# Patient Record
Sex: Female | Born: 1970 | Hispanic: Yes | Marital: Married | Smoking: Never smoker
Health system: Southern US, Community
[De-identification: ages and names within clinical notes are randomized; demographics above are authoritative.]

## PROBLEM LIST (undated history)

## (undated) DIAGNOSIS — J45909 Unspecified asthma, uncomplicated: Secondary | ICD-10-CM

---

## 2020-03-07 ENCOUNTER — Encounter: Payer: Self-pay | Admitting: Emergency Medicine

## 2020-03-07 ENCOUNTER — Other Ambulatory Visit: Payer: Self-pay

## 2020-03-07 ENCOUNTER — Emergency Department
Admission: EM | Admit: 2020-03-07 | Discharge: 2020-03-07 | Disposition: A | Discharge status: Home / Self Care | Payer: BC Managed Care – PPO | Attending: Emergency Medicine | Admitting: Emergency Medicine

## 2020-03-07 DIAGNOSIS — T63461A Toxic effect of venom of wasps, accidental (unintentional), initial encounter: Secondary | ICD-10-CM | POA: Diagnosis not present

## 2020-03-07 DIAGNOSIS — J45909 Unspecified asthma, uncomplicated: Secondary | ICD-10-CM | POA: Insufficient documentation

## 2020-03-07 DIAGNOSIS — R238 Other skin changes: Secondary | ICD-10-CM | POA: Diagnosis present

## 2020-03-07 HISTORY — DX: Unspecified asthma, uncomplicated: J45.909

## 2020-03-07 MED ORDER — IBUPROFEN 800 MG PO TABS
800.0000 mg | ORAL_TABLET | Freq: Once | ORAL | Status: AC
  Administered 2020-03-07: 800 mg via ORAL
  Filled 2020-03-07: qty 1

## 2020-03-07 MED ORDER — DEXAMETHASONE SODIUM PHOSPHATE 10 MG/ML IJ SOLN
10.0000 mg | Freq: Once | INTRAMUSCULAR | Status: DC
  Filled 2020-03-07: qty 1

## 2020-03-07 MED ORDER — FAMOTIDINE 20 MG PO TABS
20.0000 mg | ORAL_TABLET | Freq: Once | ORAL | Status: AC
  Administered 2020-03-07: 20 mg via ORAL
  Filled 2020-03-07: qty 1

## 2020-03-07 MED ORDER — DEXAMETHASONE SODIUM PHOSPHATE 10 MG/ML IJ SOLN
10.0000 mg | Freq: Once | INTRAMUSCULAR | Status: AC
  Administered 2020-03-07: 10 mg via INTRAMUSCULAR

## 2020-03-07 NOTE — ED Triage Notes (Signed)
Pt arrived via POV with c/o possible insect bit to right leg, has multiple large wheal-like areas to leg. Pt c/o pain and a little bit of itching.

## 2020-03-07 NOTE — ED Provider Notes (Signed)
[facility] Emergency Department Provider Note  ____________________________________________   First MD Initiated Contact with Patient 03/07/20 2131     (approximate)  I have reviewed the triage vital signs and the nursing notes.   HISTORY  Chief Complaint Insect Bite    HPI Candice Mendoza is a 49 y.o. female presents emergency department complaining of multiple red bumps on her legs that staying.  States she was at the park walking and felt a stinging sensation in her leg.  States she ran right over to the emergency department to be checked out.  States very painful.  Unsure of what bit her.  No trouble breathing or throat swelling.  Pain is 8/10.   Past Medical History:  Diagnosis Date  . Asthma     There are no problems to display for this patient.   History reviewed. No pertinent surgical history.  Prior to Admission medications   Not on File    Allergies Patient has no known allergies.  History reviewed. No pertinent family history.  Social History Social History   Tobacco Use  . Smoking status: Never Smoker  . Smokeless tobacco: Never Used  Substance Use Topics  . Alcohol use: Not on file  . Drug use: Not on file    Review of Systems  Constitutional: No fever/chills Eyes: No visual changes. ENT: No sore throat. Respiratory: Denies cough Cardiovascular: Denies chest pain Genitourinary: Negative for dysuria. Musculoskeletal: Negative for back pain. Skin: Negative for rash.  Positive for red areas and stings on the leg Psychiatric: no mood changes,     ____________________________________________   PHYSICAL EXAM:  VITAL SIGNS: ED Triage Vitals  Enc Vitals Group     BP 03/07/20 2054 120/72     Pulse Rate 03/07/20 2054 88     Resp 03/07/20 2054 18     Temp 03/07/20 2054 98.5 F (36.9 C)     Temp Source 03/07/20 2054 Oral     SpO2 03/07/20 2054 100 %     Weight 03/07/20 2055 116 lb (52.6 kg)     Height  03/07/20 2055 5\' 1"  (1.549 m)     Head Circumference --      Peak Flow --      Pain Score 03/07/20 2055 8     Pain Loc --      Pain Edu? --      Excl. in [place]? --     Constitutional: Alert and oriented. Well appearing and in no acute distress. Eyes: Conjunctivae are normal.  Head: Atraumatic. Nose: No congestion/rhinnorhea. Mouth/Throat: Mucous membranes are moist.  No throat swelling Neck:  supple no lymphadenopathy noted Cardiovascular: Normal rate, regular rhythm. Heart sounds are normal Respiratory: Normal respiratory effort.  No retractions, lungs c t a  GU: deferred Musculoskeletal: FROM all extremities, warm and well perfused Neurologic:  Normal speech and language.  Skin:  Skin is warm, dry and intact.  Positive for 5-6 rounds raised red areas typical of a yellowjacket sting.  No stingers are noted in the wounds.  Areas are tender to palpation. Psychiatric: Mood and affect are normal. Speech and behavior are normal.  ____________________________________________   LABS (all labs ordered are listed, but only abnormal results are displayed)  Labs Reviewed - No data to display ____________________________________________   ____________________________________________  RADIOLOGY    ____________________________________________   PROCEDURES  Procedure(s) performed: No  Procedures    ____________________________________________   INITIAL IMPRESSION / ASSESSMENT AND PLAN / ED COURSE  Pertinent labs &  imaging results that were available during my care of the patient were reviewed by me and considered in my medical decision making (see chart for details).   Patient's 49 year old female presents emergency department with leg pain after being stung by something at the park.  See HPI  Physical exam shows patient to appear well.  Multiple stings noted of the left leg.  2 on the inner thigh of the right leg.  Remainder the exam is unremarkable  Patient was given  Decadron 10 mg IM, Pepcid 20 mg p.o., ibuprofen for pain.  She is instructed to take Benadryl when she returns home she is driving.  She will be discharged in stable condition.    Candice Mendoza was evaluated in Emergency Department on 03/07/2020 for the symptoms described in the history of present illness. She was evaluated in the context of the global COVID-19 pandemic, which necessitated consideration that the patient might be at risk for infection with the SARS-CoV-2 virus that causes COVID-19. Institutional protocols and algorithms that pertain to the evaluation of patients at risk for COVID-19 are in a state of rapid change based on information released by regulatory bodies including the CDC and federal and state organizations. These policies and algorithms were followed during the patient's care in the ED.   As part of my medical decision making, I reviewed the following data within the electronic MEDICAL RECORD NUMBER Nursing notes reviewed and incorporated, Old chart reviewed, Notes from prior ED visits and [place] Controlled Substance Database  ____________________________________________   FINAL CLINICAL IMPRESSION(S) / ED DIAGNOSES  Final diagnoses:  Yellow jacket sting, accidental or unintentional, initial encounter      NEW MEDICATIONS STARTED DURING THIS VISIT:  New Prescriptions   No medications on file     Note:  This document was prepared using Dragon voice recognition software and may include unintentional dictation errors.    Faythe Ghee, PA-C 03/07/20 2236    Emily Filbert, MD 03/07/20 [phone]

## 2020-03-07 NOTE — ED Notes (Signed)
Pt states was walking in park this pm when she began to feel pain to her legs and buttocks. Pt states she is not sure what bit or stung her. Pt with multiple areas noted to legs and buttocks that appear like bee stings. Pt states she feels hot and pain in her legs. No resp distress noted.

## 2020-03-07 NOTE — Discharge Instructions (Addendum)
Take otc benadryl 25mg  4 x daily, apply ice to the areas, return to the ER if worsening Take otc ibuprofen or tylenol for pain as needed

## 2020-12-15 ENCOUNTER — Emergency Department: Payer: BC Managed Care – PPO

## 2020-12-15 ENCOUNTER — Emergency Department
Admission: EM | Admit: 2020-12-15 | Discharge: 2020-12-15 | Disposition: A | Discharge status: Home / Self Care | Payer: BC Managed Care – PPO | Attending: Emergency Medicine | Admitting: Emergency Medicine

## 2020-12-15 DIAGNOSIS — R200 Anesthesia of skin: Secondary | ICD-10-CM | POA: Diagnosis not present

## 2020-12-15 DIAGNOSIS — I639 Cerebral infarction, unspecified: Secondary | ICD-10-CM | POA: Insufficient documentation

## 2020-12-15 DIAGNOSIS — R531 Weakness: Secondary | ICD-10-CM | POA: Diagnosis not present

## 2020-12-15 DIAGNOSIS — Z20822 Contact with and (suspected) exposure to covid-19: Secondary | ICD-10-CM | POA: Insufficient documentation

## 2020-12-15 DIAGNOSIS — J45909 Unspecified asthma, uncomplicated: Secondary | ICD-10-CM | POA: Insufficient documentation

## 2020-12-15 DIAGNOSIS — R079 Chest pain, unspecified: Secondary | ICD-10-CM

## 2020-12-15 LAB — TROPONIN I (HIGH SENSITIVITY)
Troponin I (High Sensitivity): 3 ng/L (ref <18)
Troponin I (High Sensitivity): <2 ng/L (ref <18)

## 2020-12-15 LAB — BASIC METABOLIC PANEL
Anion gap: 8 (ref 5–15)
BUN: 11 mg/dL (ref 6–20)
CO2: 22 mmol/L (ref 22–32)
Calcium: 8.9 mg/dL (ref 8.9–10.3)
Chloride: 108 mmol/L (ref 98–111)
Creatinine, Ser: 0.68 mg/dL (ref 0.44–1.00)
GFR, Estimated: >60 mL/min (ref >60)
Glucose, Bld: 112 mg/dL — ABNORMAL HIGH (ref 70–99)
Potassium: 3.3 mmol/L — ABNORMAL LOW (ref 3.5–5.1)
Sodium: 138 mmol/L (ref 135–145)

## 2020-12-15 LAB — URINALYSIS, ROUTINE W REFLEX MICROSCOPIC
Bacteria, UA: NONE SEEN
Bilirubin Urine: NEGATIVE
Glucose, UA: NEGATIVE mg/dL
Ketones, ur: NEGATIVE mg/dL
Leukocytes,Ua: NEGATIVE
Nitrite: NEGATIVE
Protein, ur: NEGATIVE mg/dL
Specific Gravity, Urine: 1.01 (ref 1.005–1.030)
Squamous Epithelial / HPF: NONE SEEN (ref 0–5)
pH: 6 (ref 5.0–8.0)

## 2020-12-15 LAB — CBC WITH DIFFERENTIAL/PLATELET
Abs Immature Granulocytes: 0.03 10*3/uL (ref 0.00–0.07)
Basophils Absolute: 0.1 10*3/uL (ref 0.0–0.1)
Basophils Relative: 1 %
Eosinophils Absolute: 0.1 10*3/uL (ref 0.0–0.5)
Eosinophils Relative: 1 %
HCT: 40.7 % (ref 36.0–46.0)
Hemoglobin: 13.4 g/dL (ref 12.0–15.0)
Immature Granulocytes: 0 %
Lymphocytes Relative: 30 %
Lymphs Abs: 2.3 10*3/uL (ref 0.7–4.0)
MCH: 27.3 pg (ref 26.0–34.0)
MCHC: 32.9 g/dL (ref 30.0–36.0)
MCV: 83.1 fL (ref 80.0–100.0)
Monocytes Absolute: 0.5 10*3/uL (ref 0.1–1.0)
Monocytes Relative: 6 %
Neutro Abs: 4.5 10*3/uL (ref 1.7–7.7)
Neutrophils Relative %: 62 %
Platelets: 258 10*3/uL (ref 150–400)
RBC: 4.9 MIL/uL (ref 3.87–5.11)
RDW: 12.7 % (ref 11.5–15.5)
WBC: 7.5 10*3/uL (ref 4.0–10.5)
nRBC: 0 % (ref 0.0–0.2)

## 2020-12-15 LAB — CBC
HCT: 41.7 % (ref 36.0–46.0)
Hemoglobin: 13.8 g/dL (ref 12.0–15.0)
MCH: 27.4 pg (ref 26.0–34.0)
MCHC: 33.1 g/dL (ref 30.0–36.0)
MCV: 82.9 fL (ref 80.0–100.0)
Platelets: 263 10*3/uL (ref 150–400)
RBC: 5.03 MIL/uL (ref 3.87–5.11)
RDW: 12.8 % (ref 11.5–15.5)
WBC: 7.5 10*3/uL (ref 4.0–10.5)
nRBC: 0 % (ref 0.0–0.2)

## 2020-12-15 LAB — URINE DRUG SCREEN, QUALITATIVE (ARMC ONLY)
Amphetamines, Ur Screen: NOT DETECTED
Barbiturates, Ur Screen: NOT DETECTED
Benzodiazepine, Ur Scrn: NOT DETECTED
Cannabinoid 50 Ng, Ur ~~LOC~~: NOT DETECTED
Cocaine Metabolite,Ur ~~LOC~~: NOT DETECTED
MDMA (Ecstasy)Ur Screen: NOT DETECTED
Methadone Scn, Ur: NOT DETECTED
Opiate, Ur Screen: NOT DETECTED
Phencyclidine (PCP) Ur S: NOT DETECTED
Tricyclic, Ur Screen: NOT DETECTED

## 2020-12-15 LAB — HEPATIC FUNCTION PANEL
ALT: 11 U/L (ref 0–44)
AST: 19 U/L (ref 15–41)
Albumin: 4.3 g/dL (ref 3.5–5.0)
Alkaline Phosphatase: 41 U/L (ref 38–126)
Bilirubin, Direct: 0.1 mg/dL (ref 0.0–0.2)
Indirect Bilirubin: 0.6 mg/dL (ref 0.3–0.9)
Total Bilirubin: 0.7 mg/dL (ref 0.3–1.2)
Total Protein: 7 g/dL (ref 6.5–8.1)

## 2020-12-15 LAB — POC URINE PREG, ED: Preg Test, Ur: NEGATIVE

## 2020-12-15 LAB — ETHANOL: Alcohol, Ethyl (B): <10 mg/dL (ref <10)

## 2020-12-15 LAB — RESP PANEL BY RT-PCR (FLU A&B, COVID) ARPGX2
Influenza A by PCR: NEGATIVE
Influenza B by PCR: NEGATIVE
SARS Coronavirus 2 by RT PCR: NEGATIVE

## 2020-12-15 LAB — APTT: aPTT: 30 seconds (ref 24–36)

## 2020-12-15 LAB — PROTIME-INR
INR: 1.1 (ref 0.8–1.2)
Prothrombin Time: 14.1 seconds (ref 11.4–15.2)

## 2020-12-15 MED ORDER — IOHEXOL 350 MG/ML SOLN
100.0000 mL | Freq: Once | INTRAVENOUS | Status: AC | PRN
  Administered 2020-12-15: 100 mL via INTRAVENOUS

## 2020-12-15 MED ORDER — ASPIRIN 81 MG PO CHEW: 81.0000 mg | CHEWABLE_TABLET | Freq: Every day | ORAL | Status: DC

## 2020-12-15 MED ORDER — LORAZEPAM 2 MG/ML IJ SOLN: 1.0000 mg | INTRAMUSCULAR | Status: DC | PRN

## 2020-12-15 MED ORDER — ASPIRIN 81 MG PO CHEW: 325.0000 mg | CHEWABLE_TABLET | Freq: Once | ORAL | Status: DC

## 2020-12-15 NOTE — ED Notes (Signed)
Neurologist at bedside. 

## 2020-12-15 NOTE — ED Notes (Signed)
Neurologist at bedside in CT.  

## 2020-12-15 NOTE — Consult Note (Signed)
Referring Physician: Dr. Larinda Buttery    Chief Complaint: Acute onset of left sided weakness  HPI: Candice Mendoza is an 50 y.o. female presenting acutely to the ED from home with a c/c of sudden onset chest pain followed by numbness radiating to her LUE, then her left face, followed by her left leg. The patient is able to provide history with the help of a Spanish interpreter. She states that she was LKN at 10:40 AM, when suddenly her chest started hurting. The pain radiated to her LUE and was accompanied by LUE sensory numbness and weakness. A friend grabbed her hands and stated that they felt cold - the friend also told her that she thought the patient's LUE was turning purple. The patient looked at her left hand at that point and thought it had bluish discoloration. Her CP then got worse and she started to feel sleepy. Next, she felt sensory numbness to the left side of her face, which then radiated to her LLE. EMS was called and when they arrived she was able to stand but then needed to be lifted by them onto the stretcher. Per EMS, she was lethargic en route to the ED. On arrival to the ED, she responded to verbal stimuli, but eyes remained closed. She was alert and oriented x 3 per EMS, with a CBG of 147.   Her left sided symptoms currently consist of paresthesias, sensory numbness and weakness.  She has no history of migraine headaches. She has never had these symptoms before.   CT head negative.   CTA head and neck shows no LVO.   CTP is negative.   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 CT is normal, CTA shows no LVO and CTP shows no perfusion deficit.   Past Medical History:  Diagnosis Date  . Asthma     No past surgical history on file.  No family history on file. Social History:  reports that she has never smoked. She has never used smokeless tobacco. No history on file for alcohol use and drug use.  Allergies: No Known  Allergies  Medications: Albuterol inhaler  ROS: As per HPI.   Physical Examination: Blood pressure 127/87, pulse 78, temperature 98.9 F (37.2 C), temperature source Oral, resp. rate 20, height 5\' 1"  (1.549 m), weight 52 kg, last menstrual period 12/15/2020, SpO2 98 %.  HEENT: [place]/AT Lungs: Respirations unlabored Ext: No edema  Neurologic Examination: Mental Status: Alert, fully oriented, thought content appropriate in the context of anxious affect.  Speech fluent without evidence of aphasia.  Able to follow all commands without difficulty. Naming and repetition intact.  Cranial Nerves: II:  Visual fields intact with no extinction to DSS. PERRL.  III,IV, VI: No ptosis. EOMI. No nystagmus.  V,VII: Smile symmetric, but there is some intermittent quivering of the lips which appears more likely to be due to anxiety and/or embellishment than weakness. Facial temp sensation subjectively decreased on the left. VIII: Hearing intact to conversation IX,X: No hypophonia XI: Symmetric shoulder shrug in the context of some non-physiological appearing wiggling movements.  XII: Initially deviates tongue far to the left. Has midline tongue extension when coached. Also able to wag tongue back and forth without difficulty.  Motor: RUE and RLE with some giveway weakness. Max strength is 5/5 proximally and distally LUE and LLE with prominent giveway weakness, poor and inconsistent effort as well as labored/strained affect when testing strength or when lifting antigravity. Maximum strength elicitible is 4/5  to upper and lower extremity.  No tremor.  When testing pronator drift, LUE is held lower than RUE initially, but does not drift.  Sensory: Subjectively decreased FT and temp sensation to LUE and LLE Deep Tendon Reflexes:  2+ bilateral brachioradialis, biceps, patellae and achilles.  Toes downgoing.  Cerebellar: No ataxia with FNF bilaterally. Slow and with labored affect when performing on the left.  H-S intact bilaterally.  Gait: Deferred   Results for orders placed or performed during the hospital encounter of 12/15/20 (from the past 48 hour(s))  Basic metabolic panel     Status: Abnormal   Collection Time: 12/15/20 11:56 AM  Result Value Ref Range   Sodium 138 135 - 145 mmol/L   Potassium 3.3 (L) 3.5 - 5.1 mmol/L   Chloride 108 98 - 111 mmol/L   CO2 22 22 - 32 mmol/L   Glucose, Bld 112 (H) 70 - 99 mg/dL    Comment: Glucose reference range applies only to samples taken after fasting for at least 8 hours.   BUN 11 6 - 20 mg/dL   Creatinine, Ser 8.34 0.44 - 1.00 mg/dL   Calcium 8.9 8.9 - 19.6 mg/dL   GFR, Estimated >22 >29 mL/min    Comment: (NOTE) Calculated using the CKD-EPI Creatinine Equation (2021)    Anion gap 8 5 - 15    Comment: Performed at [facility], [address] [place], [place] [address]  CBC     Status: None   Collection Time: 12/15/20 11:56 AM  Result Value Ref Range   WBC 7.5 4.0 - 10.5 K/uL   RBC 5.03 3.87 - 5.11 MIL/uL   Hemoglobin 13.8 12.0 - 15.0 g/dL   HCT 11.9 41.7 - 40.8 %   MCV 82.9 80.0 - 100.0 fL   MCH 27.4 26.0 - 34.0 pg   MCHC 33.1 30.0 - 36.0 g/dL   RDW 14.4 81.8 - 56.3 %   Platelets 263 150 - 400 K/uL   nRBC 0.0 0.0 - 0.2 %    Comment: Performed at [facility], [address], [place], [place] [address]  Troponin I (High Sensitivity)     Status: None   Collection Time: 12/15/20 11:56 AM  Result Value Ref Range   Troponin I (High Sensitivity) 3 <18 ng/L    Comment: (NOTE) Elevated high sensitivity troponin I (hsTnI) values and significant  changes across serial measurements may suggest ACS but many other  chronic and acute conditions are known to elevate hsTnI results.  Refer to the "Links" section for chest pain algorithms and additional  guidance. Performed at [facility], [address] [place], [place] [address]    CT HEAD CODE STROKE WO CONTRAST  Result Date: 12/15/2020 CLINICAL DATA:  Code  stroke. EXAM: CT HEAD WITHOUT CONTRAST TECHNIQUE: Contiguous axial images were obtained from the base of the skull through the vertex without intravenous contrast. COMPARISON:  None. FINDINGS: Brain: There is no acute intracranial hemorrhage, mass effect, or edema. Gray-white differentiation is preserved. Ventricles and sulci are normal in size and configuration. There is no extra-axial collection. Vascular: No hyperdense vessel. Skull: Unremarkable. Sinuses/Orbits: No acute abnormality. Other: Mastoid air cells are clear. ASPECTS (Alberta Stroke Program Early CT Score) - Ganglionic level infarction (caudate, lentiform nuclei, internal capsule, insula, M1-M3 cortex): 7 - Supraganglionic infarction (M4-M6 cortex): 3 Total score (0-10 with 10 being normal): 10 IMPRESSION: There is no acute intracranial hemorrhage or evidence of acute infarction. ASPECT score is 10. These results were  called by telephone at the time of interpretation on 12/15/2020 at 12:30 pm to provider [facility] , who verbally acknowledged these results. Electronically Signed   By: Guadlupe Spanish M.D.   On: 12/15/2020 12:31    Assessment: 50 y.o. female presenting acutely to the ED from home with a c/c of sudden onset chest pain followed by numbness radiating to her LUE, then her left face, followed by her left leg.  1. Exam reveals subjective left sided sensory loss and giveway weakness of the LUE and LLE, without facial droop or dysarthria.  2. 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 CT is normal, CTA shows no LVO and CTP shows no perfusion deficit.  Risks of tPA significantly outweigh potential benefits.  3. Stroke Risk Factors - None   Recommendations: 1. MRI brain 2. Frequent neuro checks 3. ASA 325 mg po x 1, then ASA 81 mg po qd thereafter 4. Outpatient Neurology follow up.   Addendum: -- MRI brain completed: 1. No acute intracranial abnormality. 2. A few scattered foci of T2 hyperintensity  within the white matter of the bilateral frontal lobes, nonspecific. Differential diagnosis includes early chronic microvascular ischemic changes, migraine headaches, sequela of prior infectious or inflammatory process and demyelinating disease. -- Images from MRI brain personally reviewed. The findings described above are minimal and nonspecific. They fall within the range of normal based on the commonly referenced standard of one nonspecific white matter hyperintensity per decade of life.    @Electronically  signed: Dr.  12/15/2020, 12:43 PM

## 2020-12-15 NOTE — ED Notes (Signed)
CT head completed, awaiting neuro in CT to assess need for angio. IV placed.

## 2020-12-15 NOTE — ED Notes (Signed)
CT angio to be performed.

## 2020-12-15 NOTE — ED Notes (Signed)
Patient ambulated to and from restroom with steady gait

## 2020-12-15 NOTE — ED Provider Notes (Signed)
[facility] Emergency Department Provider Note   ____________________________________________   Event Date/Time   First MD Initiated Contact with Patient 12/15/20 1159     (approximate)  I have reviewed the triage vital signs and the nursing notes.   HISTORY  Chief Complaint Chest Pain    HPI Candice Mendoza is a 50 y.o. female with past medical history of asthma who presents to the ED complaining of chest pain and weakness.  Patient reports that around 10:40 AM this morning she developed pain in the left side of her chest that radiates down her left arm.  Pain is constant and described as a heaviness, associated with worsening heaviness in her left arm.  She describes a feeling of numbness in her left arm along with difficulty lifting up the arm.  She has not had any fevers, cough, or shortness of breath.  She denies any pain or swelling in her legs, but has started to have increasing numbness and difficulty lifting her left leg.  She describes heaviness and discomfort in both her left arm and her left leg.  She has never had similar symptoms in the past, denies any headache or neck pain.  She does report being told she has a "hole in her heart" while she was living in [place].        Past Medical History:  Diagnosis Date  . Asthma     There are no problems to display for this patient.   No past surgical history on file.  Prior to Admission medications   Medication Sig Start Date End Date Taking? Authorizing Provider  acetaminophen (TYLENOL) 325 MG tablet Take 650 mg by mouth every 6 (six) hours as needed.   Yes [provider]    Allergies Patient has no known allergies.  No family history on file.  Social History Social History   Tobacco Use  . Smoking status: Never Smoker  . Smokeless tobacco: Never Used    Review of Systems  Constitutional: No fever/chills Eyes: No visual changes. ENT: No sore throat. Cardiovascular:  Positive for chest pain. Respiratory: Denies shortness of breath. Gastrointestinal: No abdominal pain.  No nausea, no vomiting.  No diarrhea.  No constipation. Genitourinary: Negative for dysuria. Musculoskeletal: Negative for back pain. Skin: Negative for rash. Neurological: Negative for headaches, positive for left arm and leg numbness and weakness.  ____________________________________________   PHYSICAL EXAM:  VITAL SIGNS: ED Triage Vitals  Enc Vitals Group     BP 12/15/20 1147 127/87     Pulse Rate 12/15/20 1147 78     Resp 12/15/20 1147 20     Temp 12/15/20 1147 98.9 F (37.2 C)     Temp Source 12/15/20 1147 Oral     SpO2 12/15/20 1147 98 %     Weight 12/15/20 1148 114 lb 10.2 oz (52 kg)     Height 12/15/20 1148 5\' 1"  (1.549 m)     Head Circumference --      Peak Flow --      Pain Score 12/15/20 1148 3     Pain Loc --      Pain Edu? --      Excl. in [place]? --     Constitutional: Alert and oriented. Eyes: Conjunctivae are normal. Head: Atraumatic. Nose: No congestion/rhinnorhea. Mouth/Throat: Mucous membranes are moist. Neck: Normal ROM Cardiovascular: Normal rate, regular rhythm. Grossly normal heart sounds.  2+ radial pulses bilaterally. Respiratory: Normal respiratory effort.  No retractions. Lungs CTAB.  No  chest wall tenderness to palpation. Gastrointestinal: Soft and nontender. No distention. Genitourinary: deferred Musculoskeletal: No lower extremity tenderness nor edema. Neurologic:  Normal speech and language.  4 out of 5 strength in left upper and lower extremities, 5 out of 5 strength in right upper and lower extremities. Skin:  Skin is warm, dry and intact. No rash noted. Psychiatric: Mood and affect are normal. Speech and behavior are normal.  ____________________________________________   LABS (all labs ordered are listed, but only abnormal results are displayed)  Labs Reviewed  BASIC METABOLIC PANEL - Abnormal; Notable for the following  components:      Result Value   Potassium 3.3 (*)    Glucose, Bld 112 (*)    All other components within normal limits  URINALYSIS, ROUTINE W REFLEX MICROSCOPIC - Abnormal; Notable for the following components:   Color, Urine STRAW (*)    APPearance CLEAR (*)    Hgb urine dipstick LARGE (*)    All other components within normal limits  RESP PANEL BY RT-PCR (FLU A&B, COVID) ARPGX2  CBC  ETHANOL  URINE DRUG SCREEN, QUALITATIVE ([facility] ONLY)  HEPATIC FUNCTION PANEL  PROTIME-INR  APTT  CBC WITH DIFFERENTIAL/PLATELET  DIFFERENTIAL  POC URINE PREG, ED  TROPONIN I (HIGH SENSITIVITY)  TROPONIN I (HIGH SENSITIVITY)   ____________________________________________  EKG  ED ECG REPORT I, Chesley Noon, the attending physician, personally viewed and interpreted this ECG.   Date: 12/15/2020  EKG Time: 11:48  Rate: 70  Rhythm: normal sinus rhythm  Axis: Normal  Intervals:none  ST&T Change: None   PROCEDURES  Procedure(s) performed (including Critical Care):  .Critical Care Performed by: Chesley Noon, MD Authorized by: Chesley Noon, MD   Critical care provider statement:    Critical care time (minutes):  45   Critical care time was exclusive of:  Separately billable procedures and treating other patients and teaching time   Critical care was necessary to treat or prevent imminent or life-threatening deterioration of the following conditions:  CNS failure or compromise   Critical care was time spent personally by me on the following activities:  Discussions with consultants, evaluation of patient's response to treatment, examination of patient, ordering and performing treatments and interventions, ordering and review of laboratory studies, ordering and review of radiographic studies, pulse oximetry, re-evaluation of patient's condition, obtaining history from patient or surrogate and review of old charts   I assumed direction of critical care for this patient from another  provider in my specialty: no       ____________________________________________   INITIAL IMPRESSION / ASSESSMENT AND PLAN / ED COURSE       50 year old female with possible history of asthma who presents to the ED complaining of acute onset left chest pain described as a heaviness that radiates down her left arm and now seems to be affecting her left leg.  Symptoms started around 1040 and given weakness on her left side, code stroke was called.  CT head reviewed by me with no obvious hemorrhage, negative for acute process per radiology.  Patient evaluated by Dr. Otelia Limes of neurology and we will proceed with CTA head and neck along with CT perfusion.  CTA and CT perfusion are unremarkable per neurology and patient will not be treated with TPA.  No signs of large vessel occlusion.  We will proceed with MRI to rule out stroke or other acute process, work-up for chest pain is also unremarkable thus far.  Low suspicion for PE as patient is PERC negative.  We will repeat troponin, but if this is negative I doubt ACS given her heart score of less than 4.  Repeat troponin within normal limits, low suspicion for ACS.  MRI shows no evidence of acute stroke, does show nonspecific bilateral frontal lobe white matter hyperintensity.  This was discussed with Dr. Otelia Limes of neurology, who states these findings are very mild and may in fact be artifact.  Patient may follow-up with her PCP, otherwise was counseled to return to the ED for new worsening symptoms.  All neurologic symptoms have resolved on reassessment, patient agrees with plan.      ____________________________________________   FINAL CLINICAL IMPRESSION(S) / ED DIAGNOSES  Final diagnoses:  Nonspecific chest pain  Left-sided weakness     ED Discharge Orders    None       Note:  This document was prepared using Dragon voice recognition software and may include unintentional dictation errors.   Chesley Noon, MD 12/15/20  [phone]

## 2020-12-15 NOTE — ED Notes (Signed)
Called code stroke to thomas at [facility] per Dr. Larinda Buttery  1213

## 2020-12-15 NOTE — ED Notes (Signed)
Patient transported to MRI 

## 2020-12-15 NOTE — ED Notes (Signed)
Patient returned to room from MRI

## 2020-12-15 NOTE — ED Triage Notes (Addendum)
Patient presents to ER from work for chest pain radiating to left arm/shoulder. Per EMS patient lethargic in route to ER. Patient responds to verbal stimuli but eyes remain closed. Patient A&OX3. CBL 147 per EMS.

## 2020-12-15 NOTE — ED Notes (Signed)
MD to call CODE STROKE for left sided weakness since 1040 am.

## 2020-12-15 NOTE — ED Notes (Signed)
Patient returned to room from CT. Patient assisted to restroom, steady gait noted.

## 2020-12-15 NOTE — ED Notes (Signed)
Patient transported to CT with RN 

## 2021-11-02 IMAGING — MR MR HEAD W/O CM
13 series · 48 of 48 positions shown · non-contrast
Comparison: Head CT December 15, 2020.

CLINICAL DATA: Neuro deficit, acute, stroke suspected.

EXAM:
MRI HEAD WITHOUT CONTRAST
TECHNIQUE: Multiplanar, multiecho pulse sequences of the brain and surrounding
structures were obtained without intravenous contrast.

[Series 5: ax dwi_tracew · axial · 3.0mm · 0.65mm/px · z∈[-107,+46]mm · 2 of 48 slices shown]
[im 1/48]
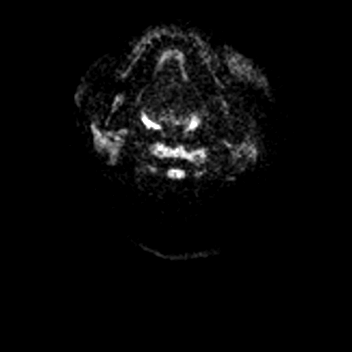
[im 48/48]
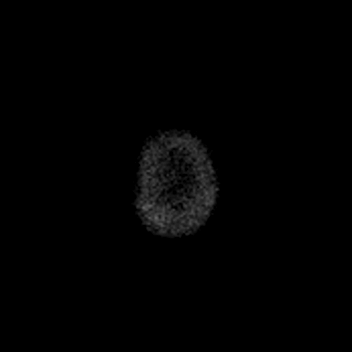

[Series 6: ax dwi_adc · axial · 3.0mm · 0.65mm/px · z∈[-107,+46]mm · 2 of 48 slices shown]
[im 1/48]
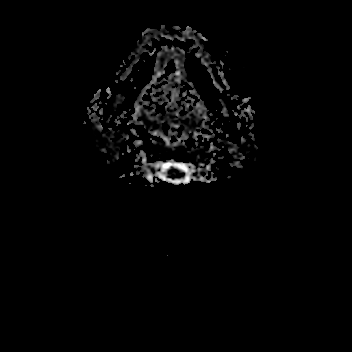
[im 48/48]
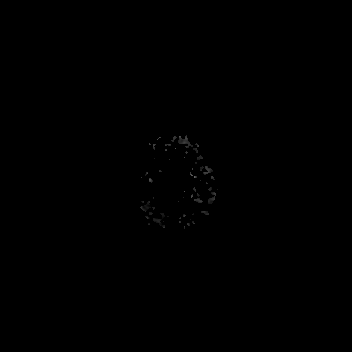

[Series 7: cor dwi_tracew · coronal · 5.0mm · 0.65mm/px · 3 of 40 slices shown]
[im 1/40]
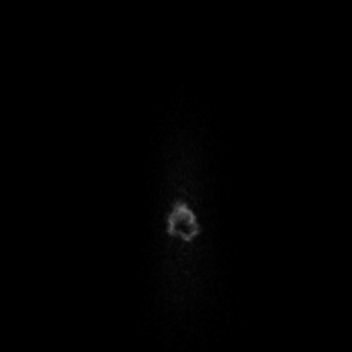
[im 20/40]
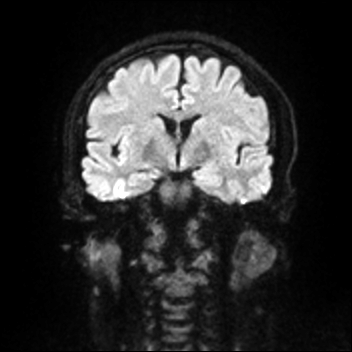
[im 40/40]
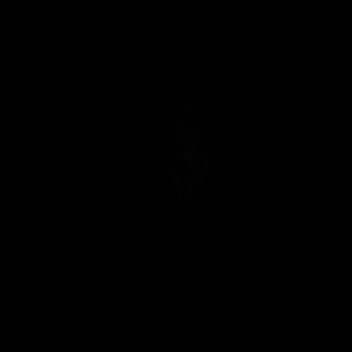

[Series 8: cor dwi_adc · coronal · 5.0mm · 0.65mm/px · 3 of 38 slices shown]
[im 1/38]
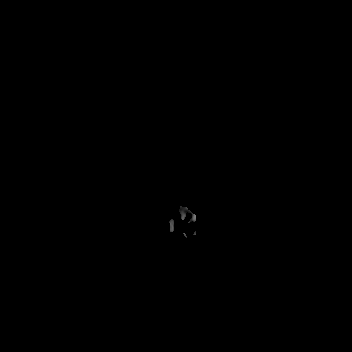
[im 19/38]
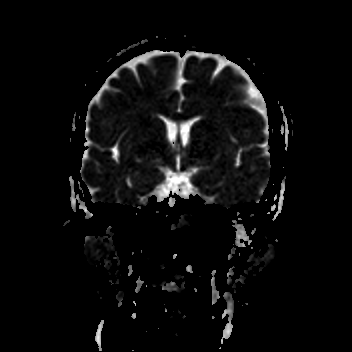
[im 38/38]
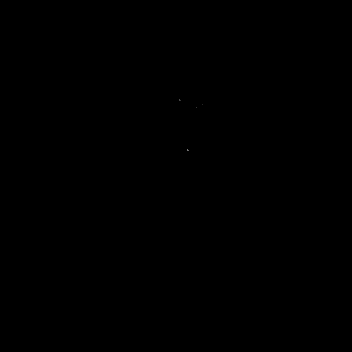

[Series 9: T1 · sagittal · 5.0mm · 0.62mm/px · 2 of 25 slices shown (1 of 2)]
[im 1/25]
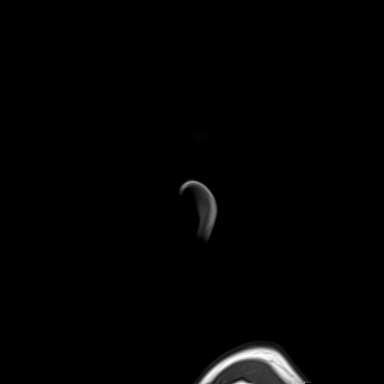
[im 25/25]
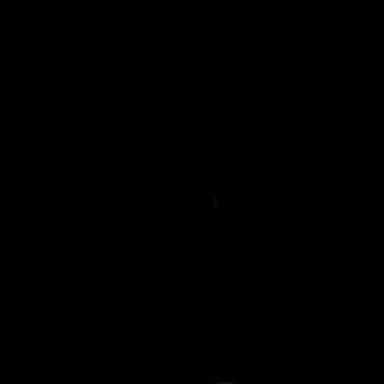

[Series 10: T2 · axial · 5.0mm · 0.53mm/px · z∈[-99,+44]mm · 2 of 25 slices shown (1 of 2)]
[im 1/25]
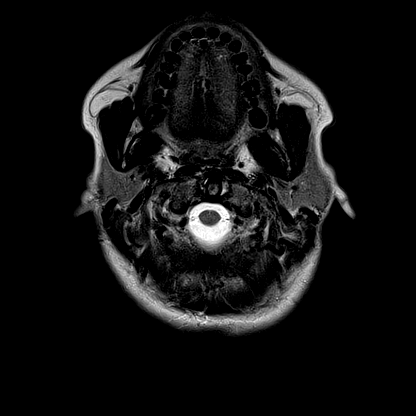
[im 25/25]
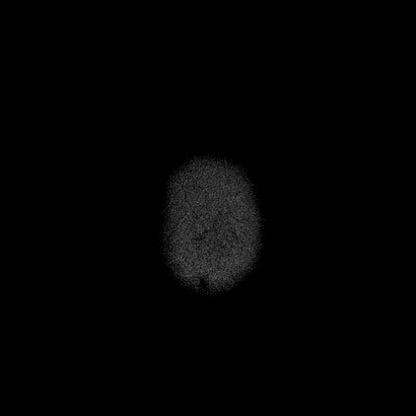

[Series 11: mag_images · axial · 3.0mm · 0.90mm/px · z∈[-116,+59]mm · 4 of 60 slices shown]
[im 1/60]
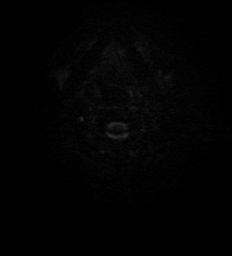
[im 20/60]
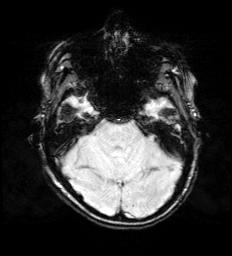
[im 40/60]
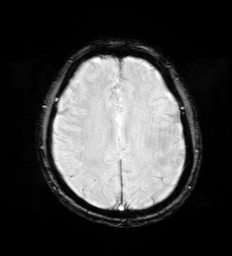
[im 60/60]
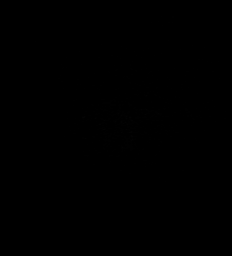

[Series 12: pha_images · axial · 3.0mm · 0.90mm/px · z∈[-113,+56]mm · 4 of 58 slices shown]
[im 1/58]
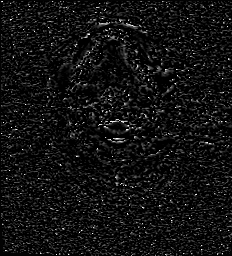
[im 20/58]
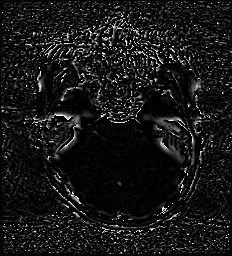
[im 39/58]
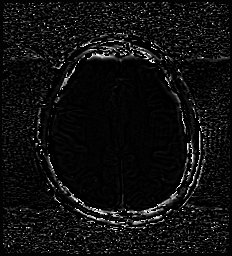
[im 58/58]
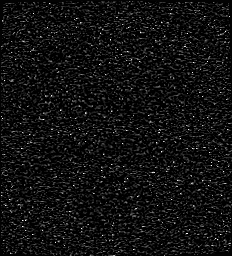

[Series 13: swi_images · axial · 3.0mm · 0.90mm/px · z∈[-116,+59]mm · 4 of 60 slices shown]
[im 1/60]
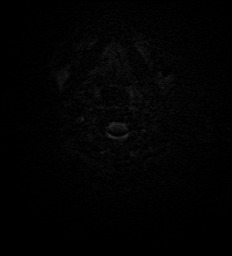
[im 20/60]
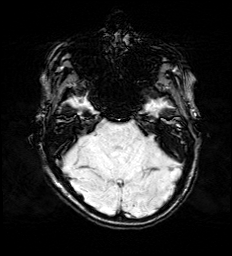
[im 40/60]
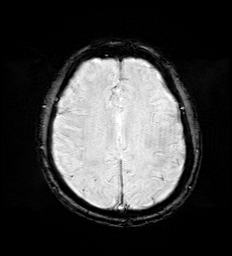
[im 60/60]
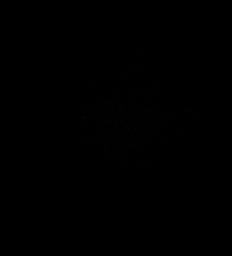

[Series 14: mip_images(sw) · axial · 24.0mm · 0.90mm/px · z∈[-106,+49]mm · 4 of 53 slices shown]
[im 1/53]
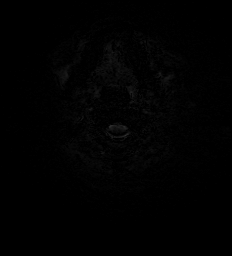
[im 18/53]
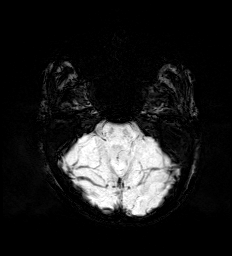
[im 35/53]
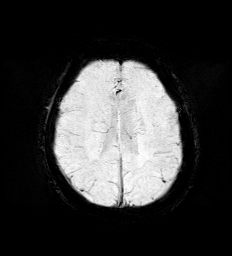
[im 53/53]
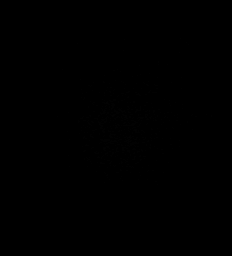

[Series 15: FLAIR · axial · 3.0mm · 0.53mm/px · z∈[-108,+53]mm · 4 of 55 slices shown]
[im 1/55]
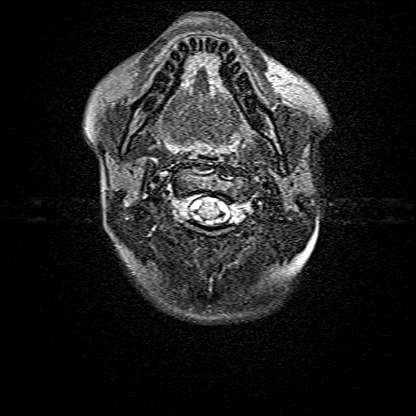
[im 19/55]
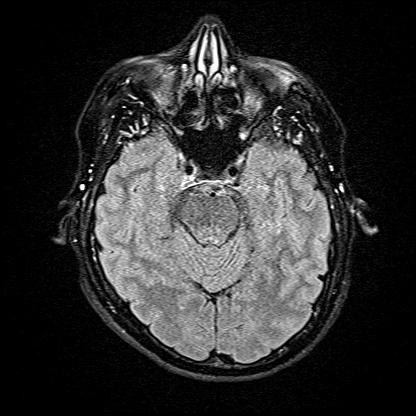
[im 37/55]
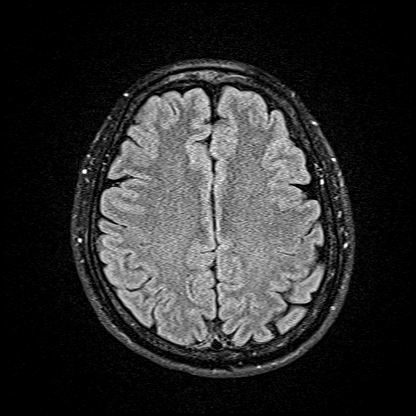
[im 55/55]
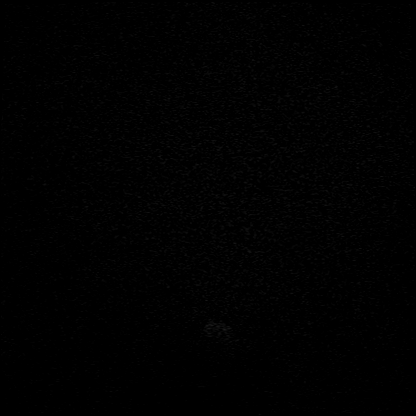

[Series 16: T1 · axial · 1.0mm · 0.98mm/px · z∈[-116,+57]mm · 12 of 176 slices shown (2 of 2)]
[im 1/176]
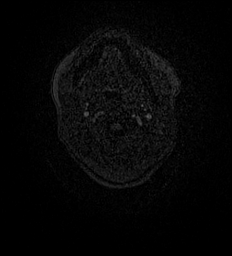
[im 16/176]
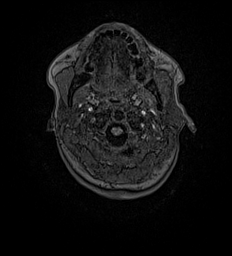
[im 32/176]
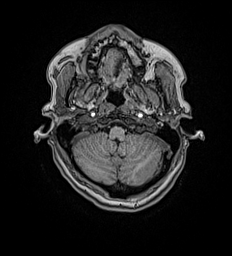
[im 48/176]
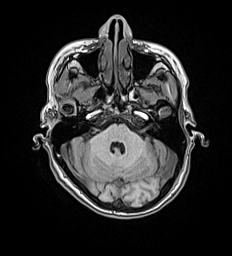
[im 64/176]
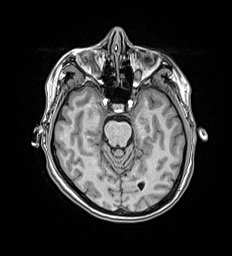
[im 80/176]
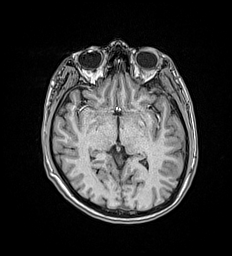
[im 96/176]
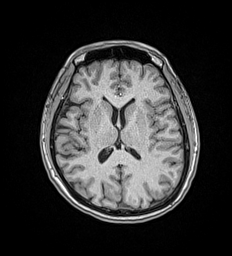
[im 112/176]
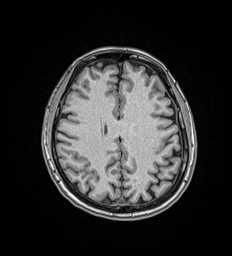
[im 128/176]
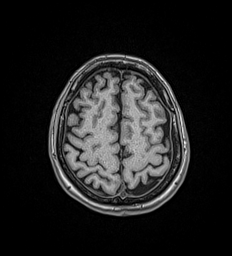
[im 144/176]
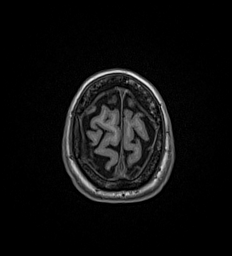
[im 160/176]
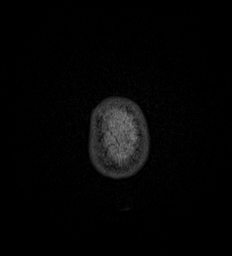
[im 176/176]
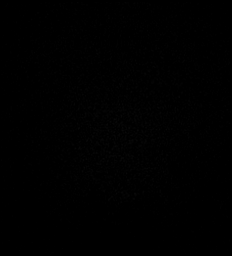

[Series 17: T2 · coronal · 5.0mm · 0.57mm/px · 2 of 29 slices shown (2 of 2)]
[im 1/29]
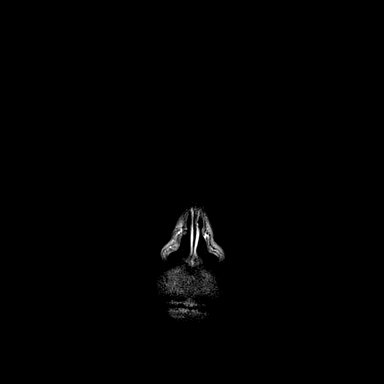
[im 29/29]
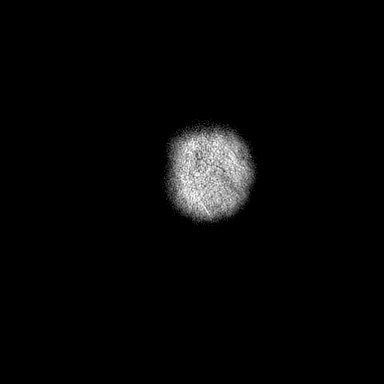

[48 of 48 positions shown; findings below may reference images not displayed]

FINDINGS: Brain: No acute infarction, hemorrhage, hydrocephalus, extra-axial
collection or mass lesion. A few scattered foci of T2 hyperintensity
are seen within the white matter of of the bilateral frontal lobes,
nonspecific.

Vascular: Normal flow voids.

Skull and upper cervical spine: No focal marrow lesion

Sinuses/Orbits: Negative.
IMPRESSION: 1. No acute intracranial abnormality.
2. A few scattered foci of T2 hyperintensity within the white matter
of the bilateral frontal lobes, nonspecific. Differential diagnosis
includes early chronic microvascular ischemic changes, migraine
headaches, sequela of prior infectious or inflammatory process and
demyelinating disease.

## 2021-11-02 IMAGING — CT CT HEAD CODE STROKE
3 series · 14 of 47 positions shown, 16 images · non-contrast
Comparison: None.

CLINICAL DATA: Code stroke.

EXAM:
CT HEAD WITHOUT CONTRAST
TECHNIQUE: Contiguous axial images were obtained from the base of the skull
through the vertex without intravenous contrast.

[Series 4: head wo · axial · 0.41mm/px · z∈[-151,-26]mm · 8 of 31 slices shown, 10 images]
[im 3/31  brain]
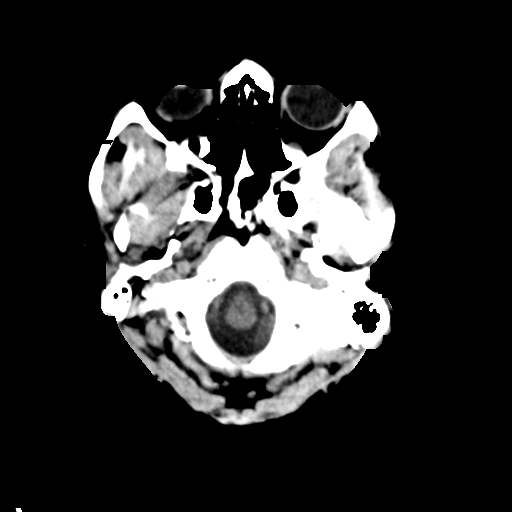
[im 3/31  bone]
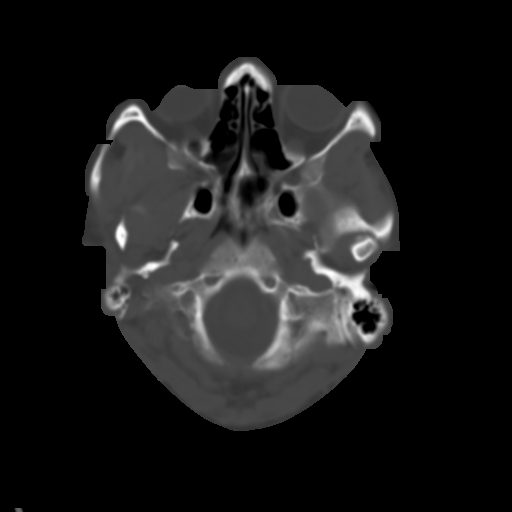
[im 7/31  brain]
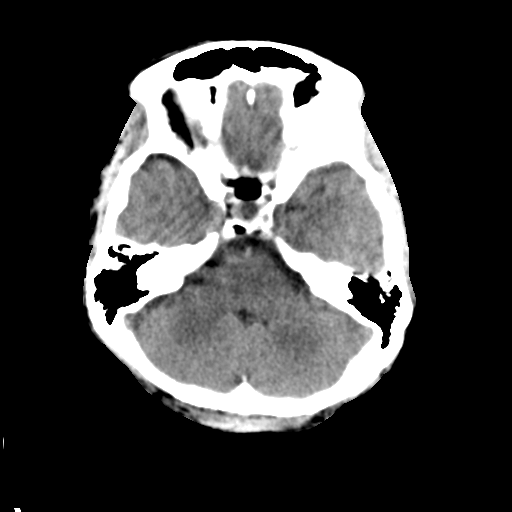
[im 10/31  brain]
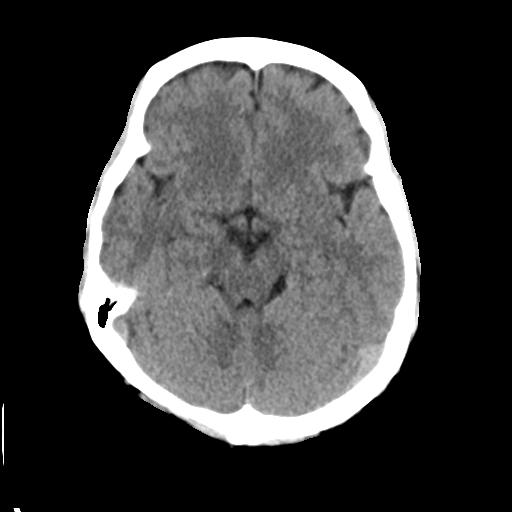
[im 14/31  brain]
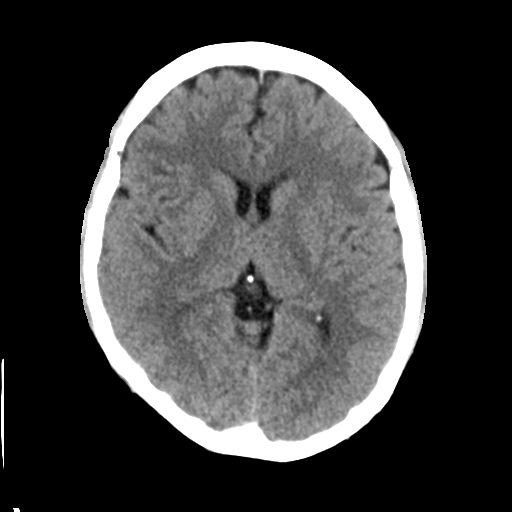
[im 17/31  brain]
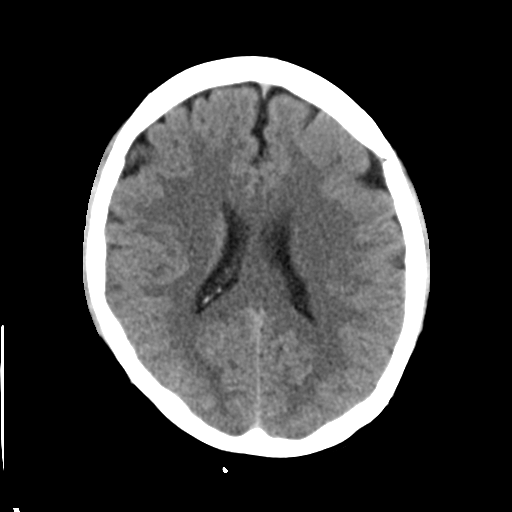
[im 17/31  bone]
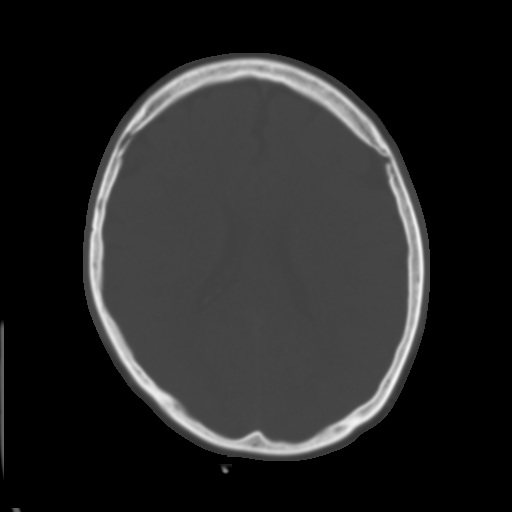
[im 21/31  brain]
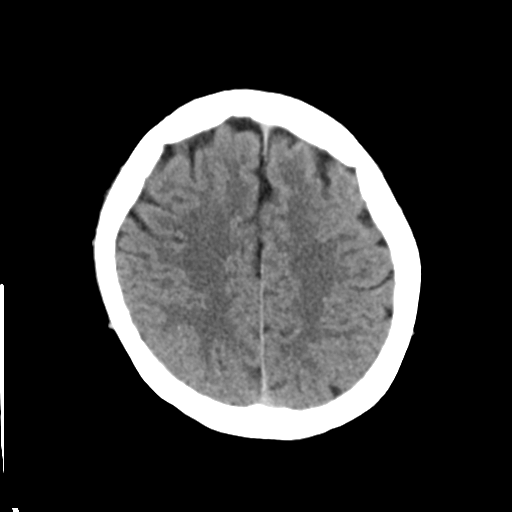
[im 24/31  brain]
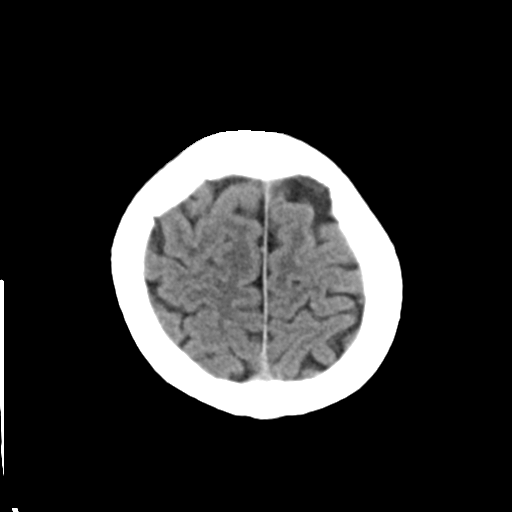
[im 28/31  brain]
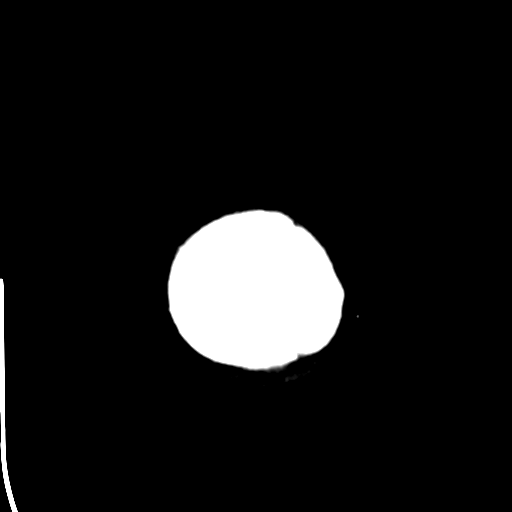

[Series 5: coronal soft tissue · coronal · 0.30mm/px · 3 of 56 slices shown]
[im 19/56  brain]
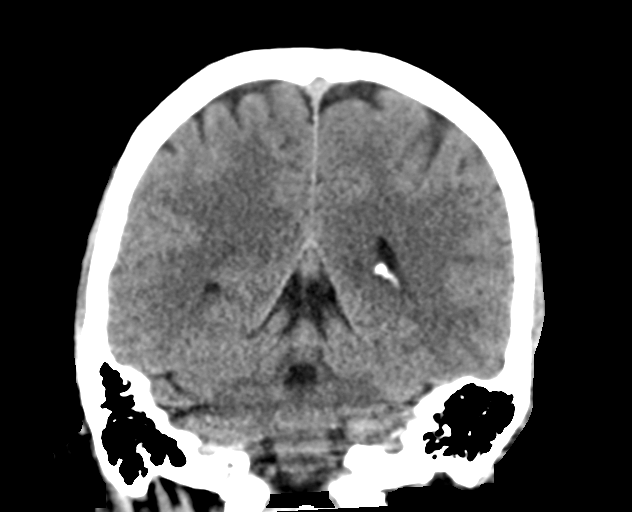
[im 25/56  brain]
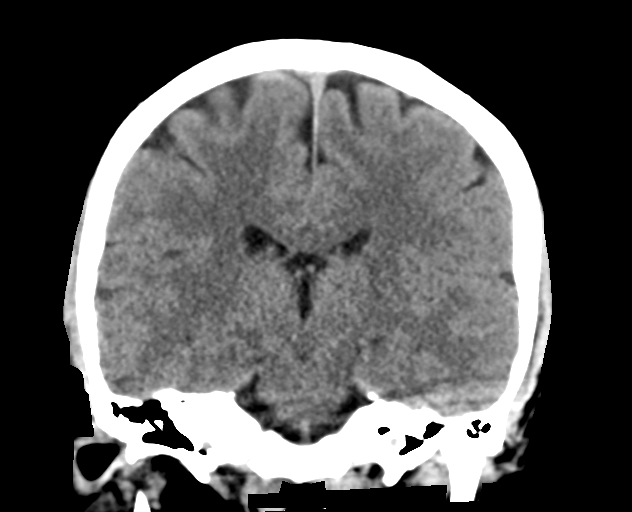
[im 31/56  brain]
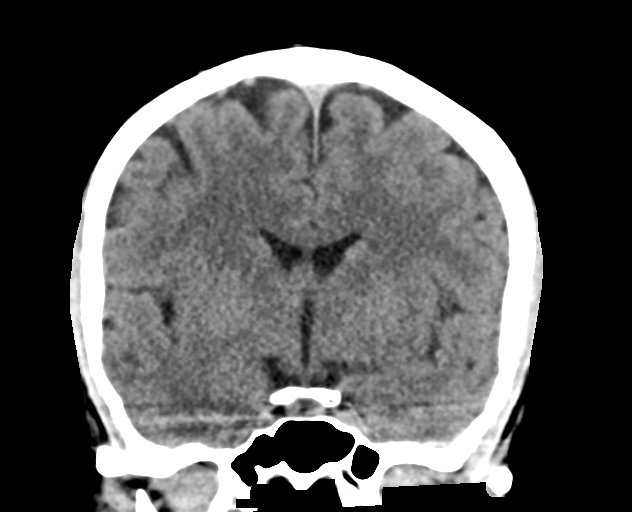

[Series 6: sagittal soft tissue · sagittal · 0.30mm/px · 3 of 51 slices shown]
[im 17/51  brain]
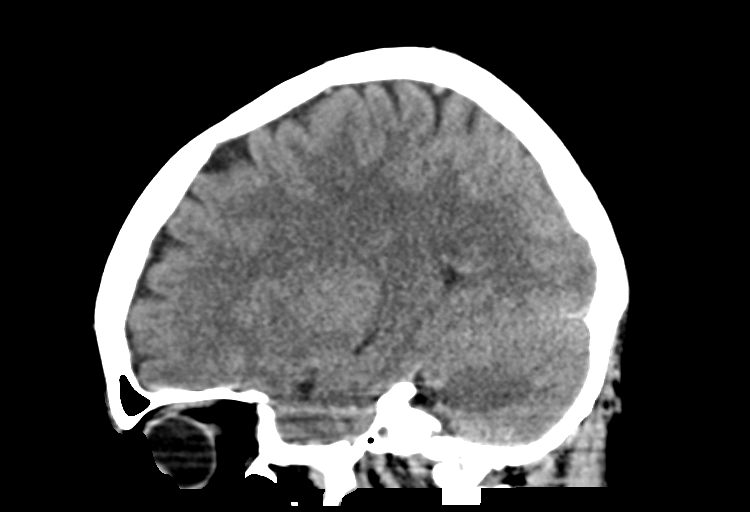
[im 26/51  brain]
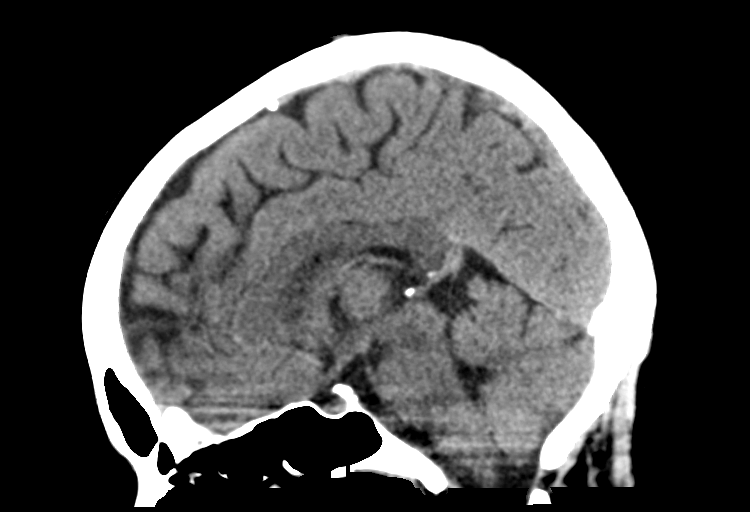
[im 34/51  brain]
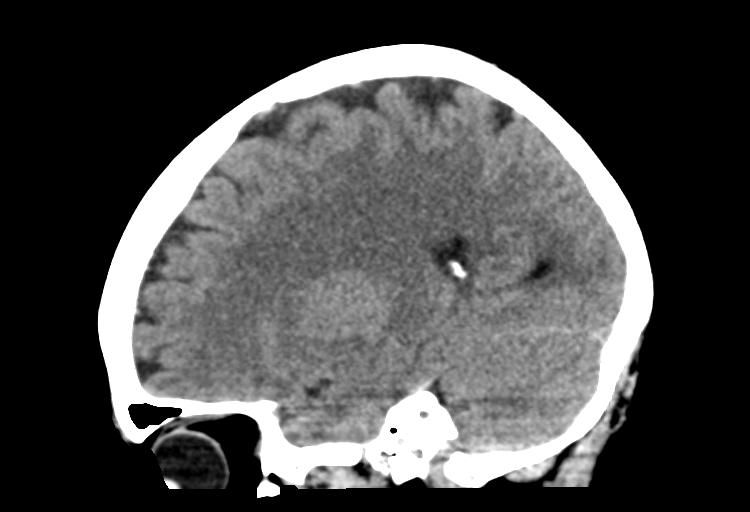

[14 of 47 positions shown; findings below may reference images not displayed]

FINDINGS: Brain: There is no acute intracranial hemorrhage, mass effect, or
edema. Gray-white differentiation is preserved. Ventricles and sulci
are normal in size and configuration. There is no extra-axial
collection.

Vascular: No hyperdense vessel.

Skull: Unremarkable.

Sinuses/Orbits: No acute abnormality.

Other: Mastoid air cells are clear.

ASPECTS (Alberta Stroke Program Early CT Score)

- Ganglionic level infarction (caudate, lentiform nuclei, internal
capsule, insula, M1-M3 cortex): 7

- Supraganglionic infarction (M4-M6 cortex): 3

Total score (0-10 with 10 being normal): 10
IMPRESSION: There is no acute intracranial hemorrhage or evidence of acute
infarction. ASPECT score is 10.

These results were called by telephone at the time of interpretation
on 12/15/2020 at [DATE] to provider DELORIER DEMEILLE , who verbally
acknowledged these results.

## 2024-01-16 ENCOUNTER — Encounter: Payer: Self-pay | Admitting: *Deleted

## 2024-01-16 ENCOUNTER — Emergency Department: Payer: Self-pay

## 2024-01-16 ENCOUNTER — Other Ambulatory Visit: Payer: Self-pay

## 2024-01-16 DIAGNOSIS — W010XXA Fall on same level from slipping, tripping and stumbling without subsequent striking against object, initial encounter: Secondary | ICD-10-CM | POA: Insufficient documentation

## 2024-01-16 DIAGNOSIS — S8991XA Unspecified injury of right lower leg, initial encounter: Secondary | ICD-10-CM | POA: Insufficient documentation

## 2024-01-16 DIAGNOSIS — J45909 Unspecified asthma, uncomplicated: Secondary | ICD-10-CM | POA: Insufficient documentation

## 2024-01-16 NOTE — ED Triage Notes (Signed)
 Pt to triage via wheelchair.  Pt fell off the treadmill today.  Pt has bil knee pain .  Abrasions to knees.  Pt denies other injury.  Pt alert

## 2024-01-17 ENCOUNTER — Emergency Department
Admission: EM | Admit: 2024-01-17 | Discharge: 2024-01-17 | Disposition: A | Discharge status: Home / Self Care | Payer: Self-pay | Attending: Emergency Medicine | Admitting: Emergency Medicine

## 2024-01-17 DIAGNOSIS — W19XXXA Unspecified fall, initial encounter: Secondary | ICD-10-CM

## 2024-01-17 DIAGNOSIS — S8991XA Unspecified injury of right lower leg, initial encounter: Secondary | ICD-10-CM

## 2024-01-17 MED ORDER — KETOROLAC TROMETHAMINE 30 MG/ML IJ SOLN
30.0000 mg | Freq: Once | INTRAMUSCULAR | Status: AC
  Administered 2024-01-17: 30 mg via INTRAMUSCULAR
  Filled 2024-01-17: qty 1

## 2024-01-17 NOTE — ED Provider Notes (Signed)
 [facility] Provider Note    Event Date/Time   First MD Initiated Contact with Patient 01/17/24 0012     (approximate)   History   Chief Complaint Fall   HPI  Candice Mendoza is a 53 y.o. female with past medical history of asthma who presents to the ED complaining of fall.  Patient reports that just prior to arrival she tripped and fell while using a treadmill, landed directly on both of her knees.  She denies hitting her head or losing consciousness, has had significant pain primarily in her right knee since the fall.  She denies any injuries to her upper extremities, has not had any pain in either hip hips or ankles.     Physical Exam   Triage Vital Signs: ED Triage Vitals  Encounter Vitals Group     BP --      Systolic BP Percentile --      Diastolic BP Percentile --      Pulse --      Resp --      Temp --      Temp src --      SpO2 --      Weight 01/16/24 2030 125 lb (56.7 kg)     Height 01/16/24 2030 4\' 8"  (1.422 m)     Head Circumference --      Peak Flow --      Pain Score 01/16/24 2029 9     Pain Loc --      Pain Education --      Exclude from Growth Chart --     Most recent vital signs: Vitals:   01/17/24 0154  BP: (!) 98/56  Pulse: 64  Resp: 17  Temp: 98.1 F (36.7 C)  SpO2: 100%    Constitutional: Alert and oriented. Eyes: Conjunctivae are normal. Head: Atraumatic. Nose: No congestion/rhinnorhea. Mouth/Throat: Mucous membranes are moist.  Cardiovascular: Normal rate, regular rhythm. Grossly normal heart sounds.  2+ radial pulses bilaterally. Respiratory: Normal respiratory effort.  No retractions. Lungs CTAB. Gastrointestinal: Soft and nontender. No distention. Musculoskeletal: Diffuse tenderness to palpation of right knee with no obvious deformity, prepatellar abrasion and edema noted.  Diffuse tenderness to palpation of left knee with no obvious deformity.  No tenderness to palpation at bilateral hips or ankles.  No  upper extremity bony tenderness. Neurologic:  Normal speech and language. No gross focal neurologic deficits are appreciated.    ED Results / Procedures / Treatments   Labs (all labs ordered are listed, but only abnormal results are displayed) Labs Reviewed - No data to display   RADIOLOGY Bilateral knee x-rays reviewed and interpreted by me with no fracture or dislocation.  PROCEDURES:  Critical Care performed: No  Procedures   MEDICATIONS ORDERED IN ED: Medications  ketorolac (TORADOL) 30 MG/ML injection 30 mg (30 mg Intramuscular Given 01/17/24 0156)     IMPRESSION / MDM / ASSESSMENT AND PLAN / ED COURSE  I reviewed the triage vital signs and the nursing notes.                              53 y.o. female with past medical history of asthma who presents to the ED complaining of bilateral knee pain following fall on treadmill earlier tonight.  Patient's presentation is most consistent with acute complicated illness / injury requiring diagnostic workup.  Differential diagnosis includes, but is not limited to, fracture, dislocation, contusion.  Patient nontoxic-appearing and in no acute distress, vital signs are unremarkable.  She is neurovascular intact to her bilateral lower extremities, x-rays of both knees are unremarkable.  She does have significantly more pain upon range of motion at her right knee, will place in knee immobilizer and provided with crutches.  She was given IM Toradol with improvement in pain and is appropriate for discharge home with outpatient orthopedic follow-up.  She was counseled to return to the ED for new or worsening symptoms, patient agrees with plan.      FINAL CLINICAL IMPRESSION(S) / ED DIAGNOSES   Final diagnoses:  Fall, initial encounter  Injury of right knee, initial encounter     Rx / DC Orders   ED Discharge Orders     None        Note:  This document was prepared using Dragon voice recognition software and may include  unintentional dictation errors.   Twilla Galea, MD 01/17/24 [phone]
